# Patient Record
Sex: Female | Born: 1935 | Race: White | Hispanic: No | Smoking: Never smoker
Health system: Southern US, Community
[De-identification: ages and names within clinical notes are randomized; demographics above are authoritative.]

## PROBLEM LIST (undated history)

## (undated) DIAGNOSIS — I2510 Atherosclerotic heart disease of native coronary artery without angina pectoris: Secondary | ICD-10-CM

## (undated) HISTORY — PX: HERNIA REPAIR: SHX51

## (undated) HISTORY — PX: CHOLECYSTECTOMY: SHX55

---

## 2009-06-28 ENCOUNTER — Ambulatory Visit: Payer: Self-pay | Admitting: Interventional Radiology

## 2009-06-28 ENCOUNTER — Encounter: Payer: Self-pay | Admitting: Emergency Medicine

## 2009-06-29 ENCOUNTER — Inpatient Hospital Stay (HOSPITAL_COMMUNITY): Admission: EM | Admit: 2009-06-29 | Discharge: 2009-07-02 | Payer: Self-pay | Admitting: Emergency Medicine

## 2009-06-29 ENCOUNTER — Ambulatory Visit: Payer: Self-pay | Admitting: Cardiovascular Disease

## 2009-06-30 ENCOUNTER — Encounter: Payer: Self-pay | Admitting: Cardiovascular Disease

## 2010-04-27 ENCOUNTER — Emergency Department (HOSPITAL_BASED_OUTPATIENT_CLINIC_OR_DEPARTMENT_OTHER): Admission: EM | Admit: 2010-04-27 | Discharge: 2010-04-27 | Payer: Self-pay | Admitting: Emergency Medicine

## 2010-04-27 ENCOUNTER — Ambulatory Visit: Payer: Self-pay | Admitting: Diagnostic Radiology

## 2010-10-30 LAB — BASIC METABOLIC PANEL
BUN: 14 mg/dL (ref 6–23)
CO2: 27 mEq/L (ref 19–32)
CO2: 27 mEq/L (ref 19–32)
Chloride: 105 mEq/L (ref 96–112)
GFR calc Af Amer: >60 mL/min (ref >60)
Glucose, Bld: 100 mg/dL — ABNORMAL HIGH (ref 70–99)
Potassium: 3.8 mEq/L (ref 3.5–5.1)
Potassium: 3.9 mEq/L (ref 3.5–5.1)
Sodium: 140 mEq/L (ref 135–145)
Sodium: 144 mEq/L (ref 135–145)

## 2010-10-30 LAB — CBC
HCT: 32.7 % — ABNORMAL LOW (ref 36.0–46.0)
Hemoglobin: 11.4 g/dL — ABNORMAL LOW (ref 12.0–15.0)
Hemoglobin: 12.6 g/dL (ref 12.0–15.0)
MCHC: 35 g/dL (ref 30.0–36.0)
MCHC: 35.5 g/dL (ref 30.0–36.0)
MCV: 92.3 fL (ref 78.0–100.0)
MCV: 92.9 fL (ref 78.0–100.0)
Platelets: 170 10*3/uL (ref 150–400)
RBC: 3.82 MIL/uL — ABNORMAL LOW (ref 3.87–5.11)
RDW: 13.8 % (ref 11.5–15.5)
WBC: 7.1 10*3/uL (ref 4.0–10.5)

## 2010-10-30 LAB — POCT CARDIAC MARKERS
CKMB, poc: 2.5 ng/mL (ref 1.0–8.0)
CKMB, poc: 4 ng/mL (ref 1.0–8.0)
Myoglobin, poc: 107 ng/mL (ref 12–200)
Myoglobin, poc: 112 ng/mL (ref 12–200)

## 2010-10-30 LAB — DIFFERENTIAL
Basophils Relative: 1 % (ref 0–1)
Eosinophils Absolute: 0.1 10*3/uL (ref 0.0–0.7)
Lymphs Abs: 1.9 10*3/uL (ref 0.7–4.0)
Monocytes Absolute: 0.6 10*3/uL (ref 0.1–1.0)
Monocytes Relative: 8 % (ref 3–12)
Neutrophils Relative %: 63 % (ref 43–77)

## 2010-10-30 LAB — POCT I-STAT, CHEM 8
BUN: 16 mg/dL (ref 6–23)
Creatinine, Ser: 0.8 mg/dL (ref 0.4–1.2)
Glucose, Bld: 94 mg/dL (ref 70–99)
Potassium: 3.8 mEq/L (ref 3.5–5.1)
Sodium: 141 mEq/L (ref 135–145)

## 2010-10-30 LAB — CK TOTAL AND CKMB (NOT AT ARMC)
CK, MB: 6.5 ng/mL — ABNORMAL HIGH (ref 0.3–4.0)
Total CK: 105 U/L (ref 7–177)

## 2010-12-02 IMAGING — CR DG SHOULDER 2+V*R*
3 series · 3 of 3 positions shown · non-contrast
Comparison: None.

CLINICAL DATA: Right shoulder pain following a fall tonight.

RIGHT SHOULDER - 2+ VIEW

[w shoulder ap internal righ]
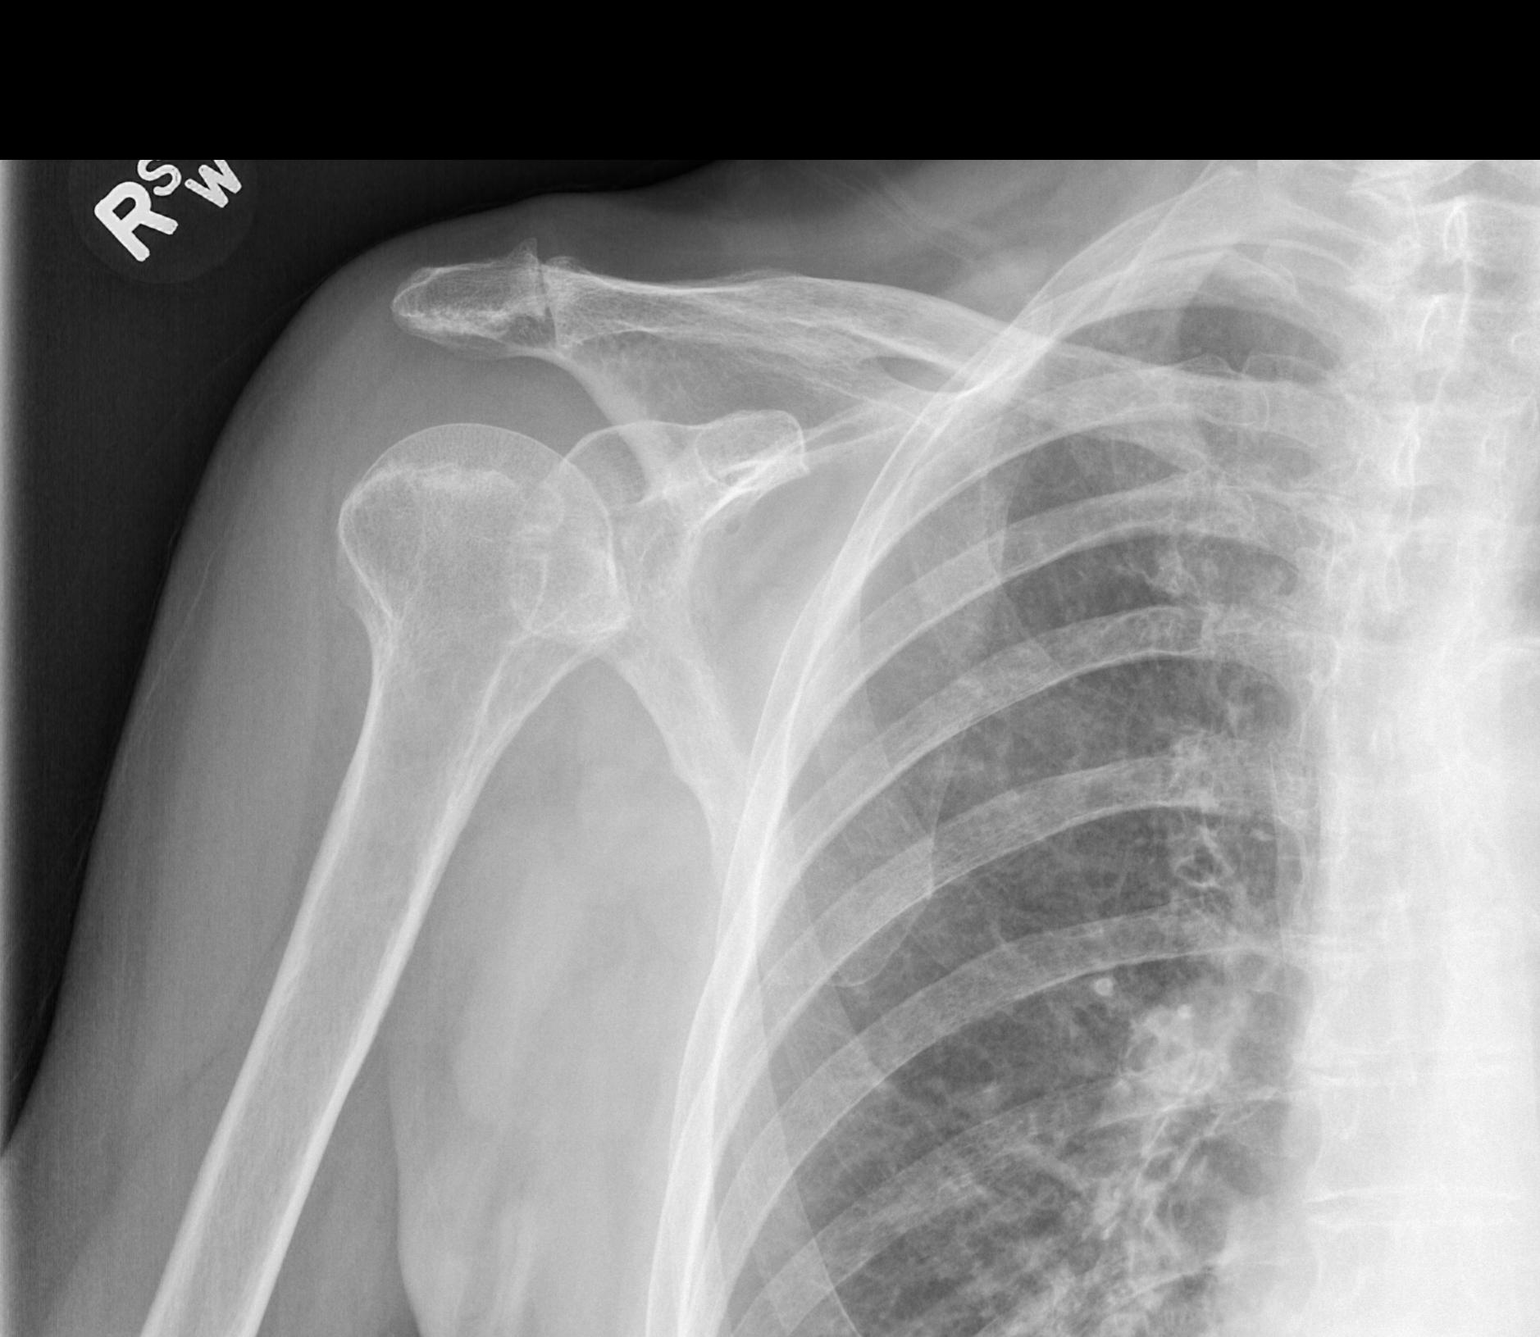

[w shoulder ap external righ]
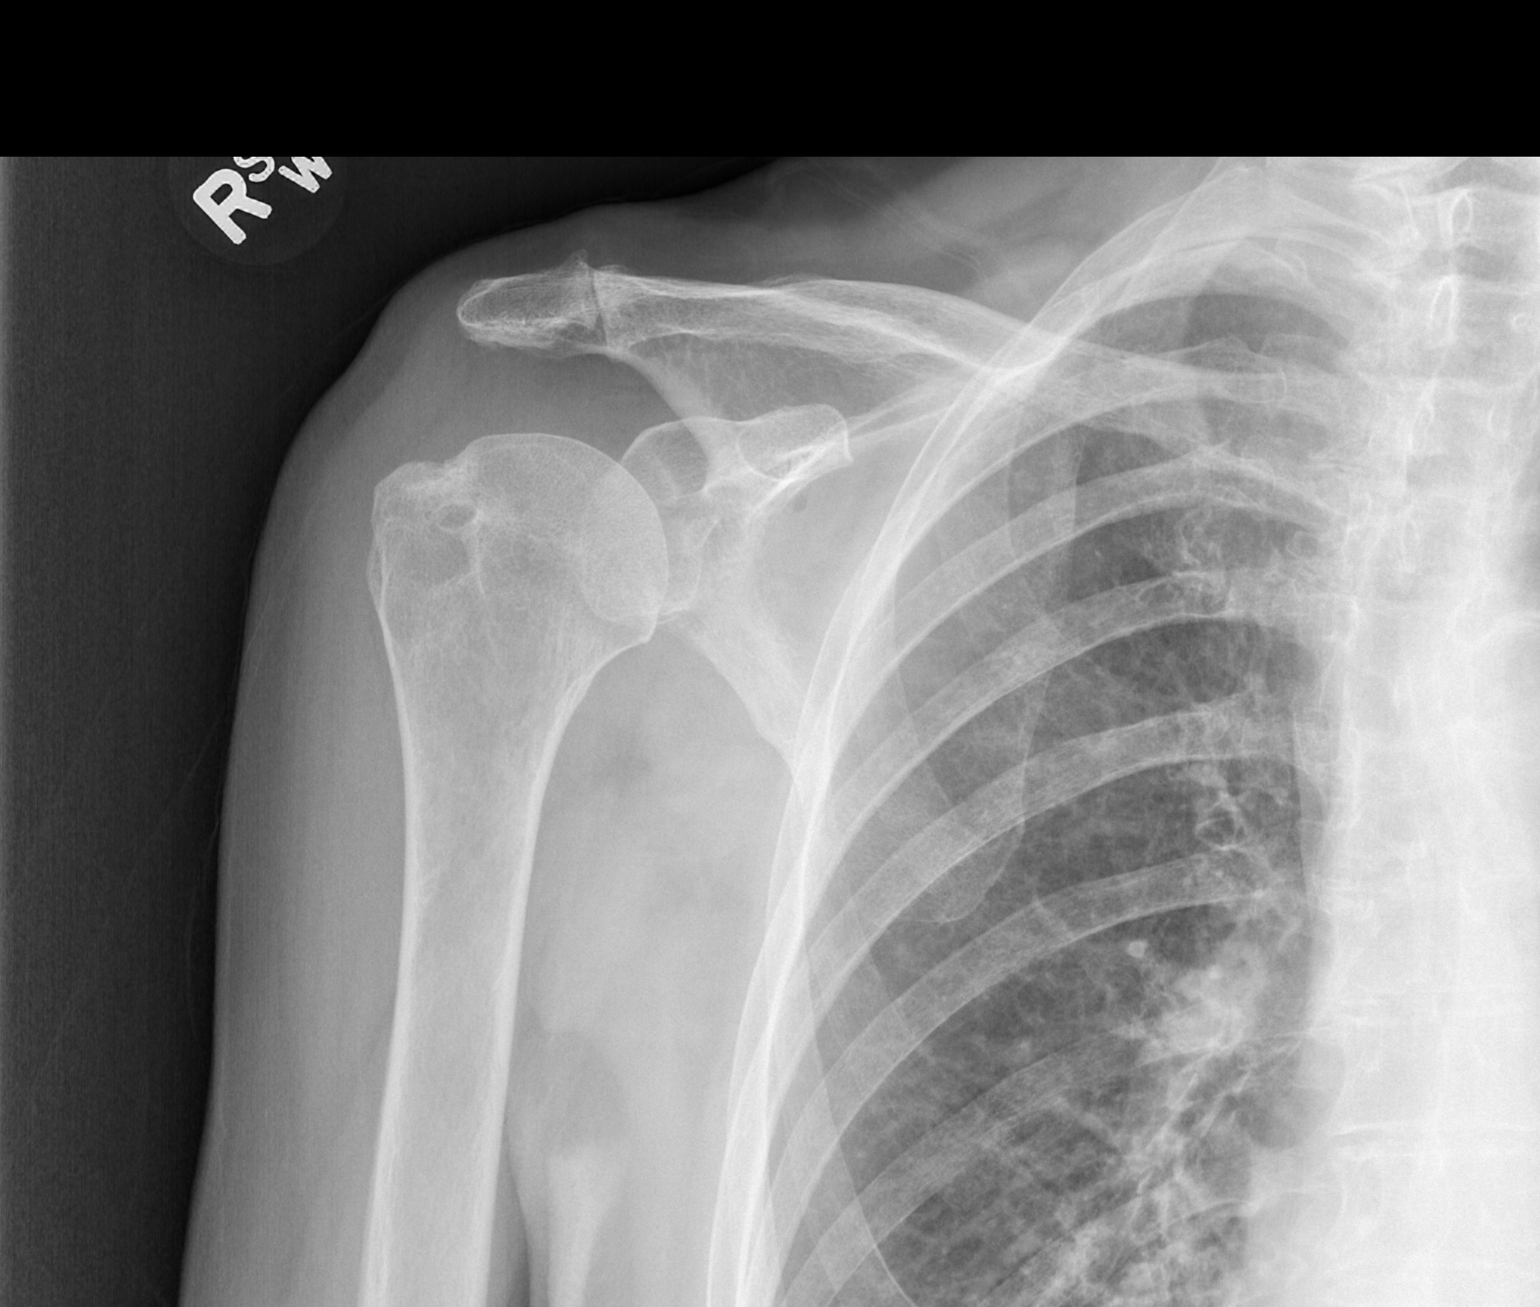

[w shoulder y view right]
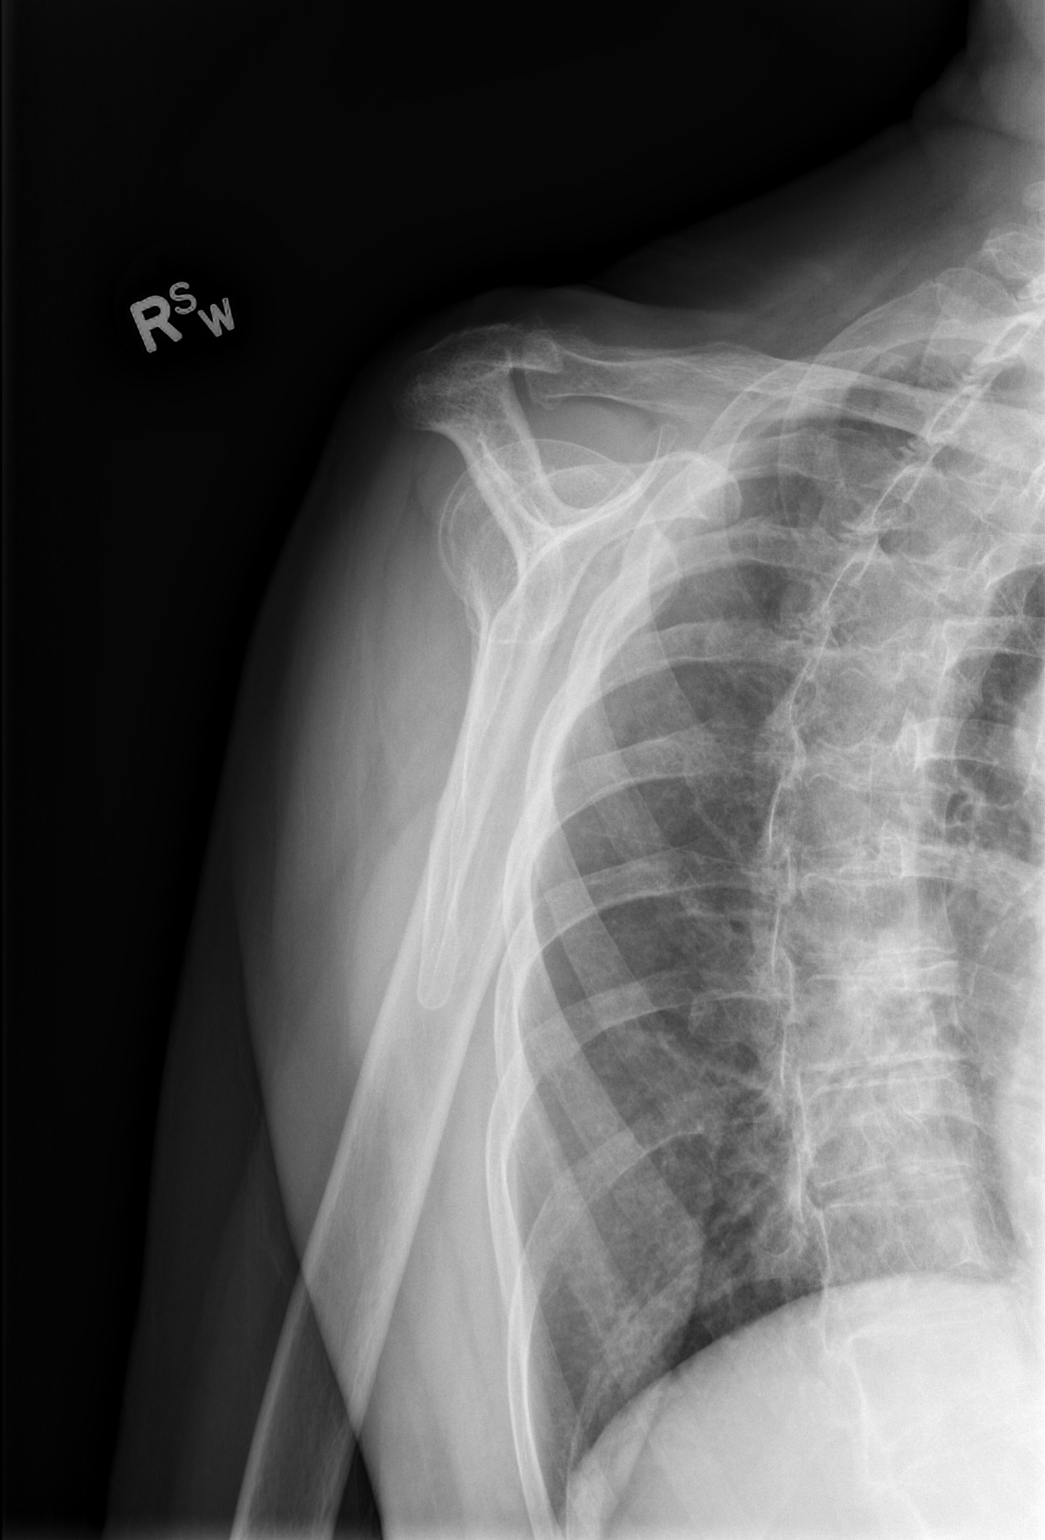

[3 of 3 positions shown; findings below may reference images not displayed]

FINDINGS: Moderate acromioclavicular spur formation.  Old, healed
distal clavicle fracture.  Inferior subluxation of the distal
clavicle relative to the acromion.  Humeral head and neck junction
cyst formation.  No acute fracture or dislocation seen.
IMPRESSION: No acute fracture or dislocation.

## 2011-01-10 ENCOUNTER — Emergency Department (HOSPITAL_BASED_OUTPATIENT_CLINIC_OR_DEPARTMENT_OTHER)
Admission: EM | Admit: 2011-01-10 | Discharge: 2011-01-10 | Disposition: A | Discharge status: Home / Self Care | Payer: Medicare Other | Attending: Emergency Medicine | Admitting: Emergency Medicine

## 2011-01-10 ENCOUNTER — Emergency Department (INDEPENDENT_AMBULATORY_CARE_PROVIDER_SITE_OTHER): Payer: Medicare Other

## 2011-01-10 DIAGNOSIS — M542 Cervicalgia: Secondary | ICD-10-CM | POA: Insufficient documentation

## 2011-01-10 DIAGNOSIS — R079 Chest pain, unspecified: Secondary | ICD-10-CM

## 2011-01-10 DIAGNOSIS — S139XXA Sprain of joints and ligaments of unspecified parts of neck, initial encounter: Secondary | ICD-10-CM | POA: Insufficient documentation

## 2011-01-10 DIAGNOSIS — W1809XA Striking against other object with subsequent fall, initial encounter: Secondary | ICD-10-CM | POA: Insufficient documentation

## 2011-01-10 DIAGNOSIS — I517 Cardiomegaly: Secondary | ICD-10-CM | POA: Insufficient documentation

## 2011-01-10 DIAGNOSIS — W19XXXA Unspecified fall, initial encounter: Secondary | ICD-10-CM

## 2011-01-10 DIAGNOSIS — I10 Essential (primary) hypertension: Secondary | ICD-10-CM | POA: Insufficient documentation

## 2011-01-10 DIAGNOSIS — E7800 Pure hypercholesterolemia, unspecified: Secondary | ICD-10-CM | POA: Insufficient documentation

## 2011-01-10 DIAGNOSIS — Y9229 Other specified public building as the place of occurrence of the external cause: Secondary | ICD-10-CM | POA: Insufficient documentation

## 2011-01-10 DIAGNOSIS — M47812 Spondylosis without myelopathy or radiculopathy, cervical region: Secondary | ICD-10-CM

## 2011-01-10 DIAGNOSIS — Z79899 Other long term (current) drug therapy: Secondary | ICD-10-CM | POA: Insufficient documentation

## 2011-01-10 DIAGNOSIS — M25519 Pain in unspecified shoulder: Secondary | ICD-10-CM

## 2013-12-08 DIAGNOSIS — H6120 Impacted cerumen, unspecified ear: Secondary | ICD-10-CM | POA: Diagnosis not present

## 2013-12-08 DIAGNOSIS — H903 Sensorineural hearing loss, bilateral: Secondary | ICD-10-CM | POA: Diagnosis not present

## 2013-12-29 DIAGNOSIS — D649 Anemia, unspecified: Secondary | ICD-10-CM | POA: Diagnosis not present

## 2013-12-29 DIAGNOSIS — I10 Essential (primary) hypertension: Secondary | ICD-10-CM | POA: Diagnosis not present

## 2013-12-29 DIAGNOSIS — E785 Hyperlipidemia, unspecified: Secondary | ICD-10-CM | POA: Diagnosis not present

## 2013-12-29 DIAGNOSIS — R7301 Impaired fasting glucose: Secondary | ICD-10-CM | POA: Diagnosis not present

## 2013-12-29 DIAGNOSIS — K439 Ventral hernia without obstruction or gangrene: Secondary | ICD-10-CM | POA: Diagnosis not present

## 2014-05-11 DIAGNOSIS — J309 Allergic rhinitis, unspecified: Secondary | ICD-10-CM | POA: Diagnosis not present

## 2014-05-11 DIAGNOSIS — E785 Hyperlipidemia, unspecified: Secondary | ICD-10-CM | POA: Diagnosis not present

## 2014-05-11 DIAGNOSIS — I10 Essential (primary) hypertension: Secondary | ICD-10-CM | POA: Diagnosis not present

## 2014-05-11 DIAGNOSIS — I5181 Takotsubo syndrome: Secondary | ICD-10-CM | POA: Diagnosis not present

## 2014-05-11 DIAGNOSIS — H6120 Impacted cerumen, unspecified ear: Secondary | ICD-10-CM | POA: Diagnosis not present

## 2014-05-18 DIAGNOSIS — E785 Hyperlipidemia, unspecified: Secondary | ICD-10-CM | POA: Diagnosis not present

## 2014-05-18 DIAGNOSIS — I10 Essential (primary) hypertension: Secondary | ICD-10-CM | POA: Diagnosis not present

## 2014-06-15 DIAGNOSIS — H60392 Other infective otitis externa, left ear: Secondary | ICD-10-CM | POA: Diagnosis not present

## 2014-06-15 DIAGNOSIS — H6122 Impacted cerumen, left ear: Secondary | ICD-10-CM | POA: Diagnosis not present

## 2014-09-14 DIAGNOSIS — H6120 Impacted cerumen, unspecified ear: Secondary | ICD-10-CM | POA: Diagnosis not present

## 2014-09-14 DIAGNOSIS — H9193 Unspecified hearing loss, bilateral: Secondary | ICD-10-CM | POA: Diagnosis not present

## 2014-12-11 DIAGNOSIS — I2510 Atherosclerotic heart disease of native coronary artery without angina pectoris: Secondary | ICD-10-CM | POA: Diagnosis not present

## 2014-12-11 DIAGNOSIS — K219 Gastro-esophageal reflux disease without esophagitis: Secondary | ICD-10-CM | POA: Diagnosis not present

## 2014-12-11 DIAGNOSIS — I214 Non-ST elevation (NSTEMI) myocardial infarction: Secondary | ICD-10-CM | POA: Diagnosis not present

## 2014-12-11 DIAGNOSIS — I10 Essential (primary) hypertension: Secondary | ICD-10-CM | POA: Diagnosis present

## 2014-12-11 DIAGNOSIS — R079 Chest pain, unspecified: Secondary | ICD-10-CM | POA: Diagnosis not present

## 2014-12-11 DIAGNOSIS — E785 Hyperlipidemia, unspecified: Secondary | ICD-10-CM | POA: Diagnosis not present

## 2014-12-11 DIAGNOSIS — I213 ST elevation (STEMI) myocardial infarction of unspecified site: Secondary | ICD-10-CM | POA: Diagnosis not present

## 2014-12-11 DIAGNOSIS — I5181 Takotsubo syndrome: Secondary | ICD-10-CM | POA: Diagnosis present

## 2015-01-10 DIAGNOSIS — H6123 Impacted cerumen, bilateral: Secondary | ICD-10-CM | POA: Diagnosis not present

## 2015-03-29 DIAGNOSIS — H6123 Impacted cerumen, bilateral: Secondary | ICD-10-CM | POA: Diagnosis not present

## 2015-04-11 ENCOUNTER — Emergency Department (HOSPITAL_BASED_OUTPATIENT_CLINIC_OR_DEPARTMENT_OTHER): Payer: Medicare Other

## 2015-04-11 ENCOUNTER — Encounter (HOSPITAL_BASED_OUTPATIENT_CLINIC_OR_DEPARTMENT_OTHER): Payer: Self-pay | Admitting: *Deleted

## 2015-04-11 ENCOUNTER — Emergency Department (HOSPITAL_BASED_OUTPATIENT_CLINIC_OR_DEPARTMENT_OTHER)
Admission: EM | Admit: 2015-04-11 | Discharge: 2015-04-11 | Disposition: A | Discharge status: Home / Self Care | Payer: Medicare Other | Attending: Emergency Medicine | Admitting: Emergency Medicine

## 2015-04-11 DIAGNOSIS — W010XXA Fall on same level from slipping, tripping and stumbling without subsequent striking against object, initial encounter: Secondary | ICD-10-CM | POA: Diagnosis not present

## 2015-04-11 DIAGNOSIS — T07XXXA Unspecified multiple injuries, initial encounter: Secondary | ICD-10-CM

## 2015-04-11 DIAGNOSIS — S0083XA Contusion of other part of head, initial encounter: Secondary | ICD-10-CM | POA: Insufficient documentation

## 2015-04-11 DIAGNOSIS — S0993XA Unspecified injury of face, initial encounter: Secondary | ICD-10-CM | POA: Diagnosis not present

## 2015-04-11 DIAGNOSIS — S0081XA Abrasion of other part of head, initial encounter: Secondary | ICD-10-CM | POA: Diagnosis not present

## 2015-04-11 DIAGNOSIS — Y9389 Activity, other specified: Secondary | ICD-10-CM | POA: Diagnosis not present

## 2015-04-11 DIAGNOSIS — S9002XA Contusion of left ankle, initial encounter: Secondary | ICD-10-CM | POA: Diagnosis not present

## 2015-04-11 DIAGNOSIS — S8001XA Contusion of right knee, initial encounter: Secondary | ICD-10-CM | POA: Insufficient documentation

## 2015-04-11 DIAGNOSIS — S0990XA Unspecified injury of head, initial encounter: Secondary | ICD-10-CM | POA: Diagnosis not present

## 2015-04-11 DIAGNOSIS — M7989 Other specified soft tissue disorders: Secondary | ICD-10-CM | POA: Diagnosis not present

## 2015-04-11 DIAGNOSIS — S52592A Other fractures of lower end of left radius, initial encounter for closed fracture: Secondary | ICD-10-CM | POA: Insufficient documentation

## 2015-04-11 DIAGNOSIS — M25572 Pain in left ankle and joints of left foot: Secondary | ICD-10-CM | POA: Diagnosis not present

## 2015-04-11 DIAGNOSIS — Y998 Other external cause status: Secondary | ICD-10-CM | POA: Insufficient documentation

## 2015-04-11 DIAGNOSIS — Y9289 Other specified places as the place of occurrence of the external cause: Secondary | ICD-10-CM | POA: Insufficient documentation

## 2015-04-11 DIAGNOSIS — S52502A Unspecified fracture of the lower end of left radius, initial encounter for closed fracture: Secondary | ICD-10-CM

## 2015-04-11 DIAGNOSIS — S6991XA Unspecified injury of right wrist, hand and finger(s), initial encounter: Secondary | ICD-10-CM | POA: Diagnosis not present

## 2015-04-11 DIAGNOSIS — I2510 Atherosclerotic heart disease of native coronary artery without angina pectoris: Secondary | ICD-10-CM | POA: Diagnosis not present

## 2015-04-11 DIAGNOSIS — S6992XA Unspecified injury of left wrist, hand and finger(s), initial encounter: Secondary | ICD-10-CM | POA: Diagnosis present

## 2015-04-11 DIAGNOSIS — S52572A Other intraarticular fracture of lower end of left radius, initial encounter for closed fracture: Secondary | ICD-10-CM | POA: Diagnosis not present

## 2015-04-11 HISTORY — DX: Atherosclerotic heart disease of native coronary artery without angina pectoris: I25.10

## 2015-04-11 MED ORDER — ACETAMINOPHEN 325 MG PO TABS
650.0000 mg | ORAL_TABLET | Freq: Once | ORAL | Status: AC
  Administered 2015-04-11: 650 mg via ORAL
  Filled 2015-04-11: qty 2

## 2015-04-11 NOTE — ED Notes (Signed)
She tripped and fell face first landing on concrete. By standers helped her up and she drove herself to her daughters. Injury to her chin, right knee and left wrist. Bruise and swelling to her chin and knee. Swelling and pain to her wrist.

## 2015-04-11 NOTE — ED Notes (Signed)
Tracy Gerken placed the splint and sling on patient.

## 2015-04-11 NOTE — Discharge Instructions (Signed)
Contusion °A contusion is a deep bruise. Contusions happen when an injury causes bleeding under the skin. Signs of bruising include pain, puffiness (swelling), and discolored skin. The contusion may turn blue, purple, or yellow. °HOME CARE  °· Put ice on the injured area. °¨ Put ice in a plastic bag. °¨ Place a towel between your skin and the bag. °¨ Leave the ice on for 15-20 minutes, 03-04 times a day. °· Only take medicine as told by your doctor. °· Rest the injured area. °· If possible, raise (elevate) the injured area to lessen puffiness. °GET HELP RIGHT AWAY IF:  °· You have more bruising or puffiness. °· You have pain that is getting worse. °· Your puffiness or pain is not helped by medicine. °MAKE SURE YOU:  °· Understand these instructions. °· Will watch your condition. °· Will get help right away if you are not doing well or get worse. °Document Released: 01/01/2008 Document Revised: 10/07/2011 Document Reviewed: 05/20/2011 °[employer]® Patient Information ©2015 [employer]. This information is not intended to replace advice given to you by your health care provider. Make sure you discuss any questions you have with your health care provider. ° °

## 2015-04-11 NOTE — ED Provider Notes (Addendum)
CSN: [id number]     Arrival date & time 04/11/15  1452 History   First MD Initiated Contact with Patient 04/11/15 1508     Chief Complaint  Patient presents with  . Fall     (Consider location/radiation/quality/duration/timing/severity/associated sxs/prior Treatment) Patient is a 81 y.o. female presenting with fall. The history is provided by the patient.  Fall This is a new (Pt drove to town and was walking by the [employer] looking in the windows.  when she was walking back to her car she tripped and fell face first onthe concrete.  2 men helped her up and she was able to drive to her daughter and grandaughter's house ) problem. The current episode started 1 to 2 hours ago. The problem occurs constantly. The problem has been gradually worsening. Associated symptoms include headaches. Pertinent negatives include no chest pain, no abdominal pain and no shortness of breath. Associated symptoms comments: Left wrist, right hand, left ankle, right knee pain.  Severe chin pain and bruising.  Possible brief LOC after hitting face on the pavement.  No neck or back pain.  Was able to ambulate after the fall.  No anticoagulation except for 81mg  asa.. The symptoms are aggravated by bending and twisting. The symptoms are relieved by rest. She has tried rest and a cold compress for the symptoms. The treatment provided no relief.    Past Medical History  Diagnosis Date  . Coronary artery disease    Past Surgical History  Procedure Laterality Date  . Cholecystectomy    . Hernia repair     No family history on file. Social History  Substance Use Topics  . Smoking status: Never Smoker   . Smokeless tobacco: None  . Alcohol Use: No   OB History    No data available     Review of Systems  Respiratory: Negative for shortness of breath.   Cardiovascular: Negative for chest pain.  Gastrointestinal: Negative for abdominal pain.  Neurological: Positive for headaches.  All other systems reviewed and  are negative.     Allergies  Review of patient's allergies indicates no known allergies.  Home Medications   Prior to Admission medications   Medication Sig Start Date End Date Taking? Authorizing Provider  Atorvastatin Calcium (LIPITOR PO) Take by mouth.   Yes Historical Provider, MD  CARVEDILOL PO Take by mouth.   Yes Historical Provider, MD   BP 126/101 mmHg  Pulse 77  Temp(Src) 98.5 F (36.9 C) (Oral)  Resp 20  Ht 5\' 2"  (1.575 m)  Wt 110 lb (49.896 kg)  BMI 20.11 kg/m2  SpO2 97% Physical Exam  Constitutional: She is oriented to person, place, and time. She appears well-developed and well-nourished. No distress.  HENT:  Head: Normocephalic. Head is with abrasion.    Mouth/Throat: Oropharynx is clear and moist. No trismus in the jaw. No lacerations.  No acute dental fractures   Eyes: Conjunctivae and EOM are normal. Pupils are equal, round, and reactive to light.  Neck: Normal range of motion. Neck supple. No spinous process tenderness and no muscular tenderness present.  Cardiovascular: Normal rate, regular rhythm and intact distal pulses.   No murmur heard. Pulmonary/Chest: Effort normal and breath sounds normal. No respiratory distress. She has no wheezes. She has no rales.  Abdominal: Soft. She exhibits no distension. There is no tenderness. There is no rebound and no guarding.  Musculoskeletal: She exhibits no edema.       Left wrist: She exhibits decreased range of motion,  tenderness, bony tenderness, swelling and deformity.       Right knee: She exhibits swelling and ecchymosis. She exhibits normal range of motion. Tenderness found. Medial joint line tenderness noted.       Left ankle: She exhibits ecchymosis. She exhibits normal range of motion and no deformity. Tenderness. Lateral malleolus tenderness found.       Right hand: She exhibits tenderness and bony tenderness. She exhibits normal range of motion.       Hands:      Legs: Neurological: She is alert and  oriented to person, place, and time.  Skin: Skin is warm and dry. No rash noted. No erythema.  Psychiatric: She has a normal mood and affect. Her behavior is normal.  Nursing note and vitals reviewed.   ED Course  Procedures (including critical care time) Labs Review Labs Reviewed - No data to display  Imaging Review Dg Wrist Complete Left  04/11/2015   CLINICAL DATA:  Tripped, fall face first landing on concrete. Left wrist pain.  EXAM: LEFT WRIST - COMPLETE 3+ VIEW  COMPARISON:  06/28/2009  FINDINGS: There is a comminuted intraarticular fracture through the distal left radius. Ulnar styloid fracture noted.  Advanced degenerative changes at the first carpometacarpal joint.  IMPRESSION: Comminuted, slightly impacted intra-articular distal left radial fracture. Ulnar styloid fracture.   Electronically Signed   By: Charlett Nose M.D.   On: 04/11/2015 16:31   Dg Ankle Complete Left  04/11/2015   CLINICAL DATA:  Fall.  Ankle pain.  EXAM: LEFT ANKLE COMPLETE - 3+ VIEW  COMPARISON:  None.  FINDINGS: The ankle mortise is congruent. The talar dome appears intact. No fracture is identified.  IMPRESSION: Negative.   Electronically Signed   By: Andreas Newport M.D.   On: 04/11/2015 16:34   Ct Head Wo Contrast  04/11/2015   CLINICAL DATA:  Fall from a standing position. Soft tissue injury to the chin.  EXAM: CT HEAD WITHOUT CONTRAST  CT MAXILLOFACIAL WITHOUT CONTRAST  TECHNIQUE: Multidetector CT imaging of the head and maxillofacial structures were performed using the standard protocol without intravenous contrast. Multiplanar CT image reconstructions of the maxillofacial structures were also generated.  COMPARISON:  CT head without contrast 03/20/2011  FINDINGS: CT HEAD FINDINGS  A thin extra-axial collection is present bilaterally. This is slightly higher density than the CSF. Maximal measurement is 4 mm on the right and 3 mm on the left. There is no mass effect or midline shift. These are concerning for  small subdural hematomas, right greater than left. They are age indeterminate without layering acute blood products.  Mild subcortical white matter changes are present bilaterally. A remote lacunar infarct is present in the left caudate head. The basal ganglia are otherwise intact. No acute infarct or parenchymal hemorrhage is present. The ventricles are of normal size.  A prominent polyp or mucous retention cyst is present in the right maxillary sinus. The remaining paranasal sinuses in the right mastoid air cells are clear. There is some fluid in left mastoid air cells. No obstructing nasopharyngeal lesion is evident. Atherosclerotic calcifications are present in the cavernous internal carotid arteries bilaterally.  CT MAXILLOFACIAL FINDINGS  A soft tissue hematoma at is noted along the anterior mandible. There is no underlying fracture. The residual teeth are intact.  The mandible is intact and located. A polyp or mucous retention cyst is present in the right maxillary sinus. The facial bones are otherwise intact. Bilateral sub cm submandibular lymph nodes.  Degenerative changes are  present in the upper cervical spine without acute fracture or traumatic subluxation.  IMPRESSION: 1. Small bilateral subdural collections, concerning for age indeterminate hematomas. 2. Mild white matter disease is within normal limits for age. 3. Remote lacunar infarct of the left caudate head. 4. Prominent hematoma anterior to the genu of the mandible without an underlying fracture. These results were called by telephone at the time of interpretation on 04/11/2015 at 4:26 pm to Dr. Gwyneth Sprout , who verbally acknowledged these results.   Electronically Signed   By: Marin Roberts M.D.   On: 04/11/2015 16:31   Dg Knee Complete 4 Views Right  04/11/2015   CLINICAL DATA:  initial encounter tripped and fell face first landing on concrete. By standers helped her up and she drove herself to her daughters. Injury to her chin,  right knee and left wrist. Bruise and swelling to her chin and knee. Swelling and pain to her wrist.  EXAM: RIGHT KNEE - COMPLETE 4+ VIEW  COMPARISON:  None.  FINDINGS: Medial lateral meniscal calcification. Mild narrowing and osteophyte formation of the medial compartment. No fracture or dislocation. No joint effusion. Femoral popliteal artery calcification. Nonspecific soft tissue calcification in the suprapatellar region.  IMPRESSION: No acute findings   Electronically Signed   By: Esperanza Heir M.D.   On: 04/11/2015 16:32   Dg Hand Complete Right  04/11/2015   CLINICAL DATA:  Fall.  Hand pain.  Hand injury.  EXAM: RIGHT HAND - COMPLETE 3+ VIEW  COMPARISON:  None.  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 Small vessel atherosclerotic calcification is present. There is no displaced fracture. Index finger DIP joint osteoarthritis. Mild small finger DIP joint osteoarthritis. Distal radius grossly appears within normal limits however there is soft tissue swelling over the dorsal aspect of the wrist. Severe STT joint osteoarthritis.  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 If there is wrist pain, consider dedicated wrist radiographs.   Electronically Signed   By: Andreas Newport M.D.   On: 04/11/2015 16:33   Ct Maxillofacial Wo Cm  04/11/2015   CLINICAL DATA:  Fall from a standing position. Soft tissue injury to the chin.  EXAM: CT HEAD WITHOUT CONTRAST  CT MAXILLOFACIAL WITHOUT CONTRAST  TECHNIQUE: Multidetector CT imaging of the head and maxillofacial structures were performed using the standard protocol without intravenous contrast. Multiplanar CT image reconstructions of the maxillofacial structures were also generated.  COMPARISON:  CT head without contrast 03/20/2011  FINDINGS: CT HEAD FINDINGS  A thin extra-axial collection is present bilaterally. This is slightly higher density than the CSF. Maximal measurement is 4 mm on the right and 3  mm on the left. There is no mass effect or midline shift. These are concerning for small subdural hematomas, right greater than left. They are age indeterminate without layering acute blood products.  Mild subcortical white matter changes are present bilaterally. A remote lacunar infarct is present in the left caudate head. The basal ganglia are otherwise intact. No acute infarct or parenchymal hemorrhage is present. The ventricles are of normal size.  A prominent polyp or mucous retention cyst is present in the right maxillary sinus. The remaining paranasal sinuses in the right mastoid air cells are clear. There is some fluid in left mastoid air cells. No obstructing nasopharyngeal lesion is evident. Atherosclerotic calcifications are present in the cavernous internal carotid arteries bilaterally.  CT MAXILLOFACIAL FINDINGS  A soft tissue hematoma at is noted along the anterior mandible.  There is no underlying fracture. The residual teeth are intact.  The mandible is intact and located. A polyp or mucous retention cyst is present in the right maxillary sinus. The facial bones are otherwise intact. Bilateral sub cm submandibular lymph nodes.  Degenerative changes are present in the upper cervical spine without acute fracture or traumatic subluxation.  IMPRESSION: 1. Small bilateral subdural collections, concerning for age indeterminate hematomas. 2. Mild white matter disease is within normal limits for age. 3. Remote lacunar infarct of the left caudate head. 4. Prominent hematoma anterior to the genu of the mandible without an underlying fracture. These results were called by telephone at the time of interpretation on 04/11/2015 at 4:26 pm to Dr. Gwyneth Sprout , who verbally acknowledged these results.   Electronically Signed   By: Marin Roberts M.D.   On: 04/11/2015 16:31   I have personally reviewed and evaluated these images and lab results as part of my medical decision-making.   EKG  Interpretation None      MDM   Final diagnoses:  Distal radius fracture, left, closed, initial encounter  Contusion, multiple sites    Pt with mechanical fall today outside a [employer] store. Landed on her face, outstretched arms and knees on the concrete.  Unclear if she had LOC.  She was able to ambulate after and drove to her relatives house.  She denies hip or upper leg pain.  Mild left ankle pain and right knee pain.  Deformity and severe pain over the left wrist.  Mild right hand pain.  No c-spine tenderness.  C/o of mild HA and large chin hematoma.  No malocclusion and vision is normal.  Neuro exam wnl.  Pt denies any anticoagulation other than 81mg  asa.  Imaging pending and pt given tylenol for pain.  5:36 PM Patient states she is negative except for comminuted, impacted left distal radius fracture. Short arm splint and sling applied. Patient will follow-up with Dr. Amanda Pea.  Secondly patient's head CT showed small bilateral subdural collections is age indeterminate. These with the family patient had a bad fall approximately 5 years ago with intercranial hemorrhage requiring hospitalization. Since that time she has had no falls and she takes only a baby aspirin daily. Patient's mental status and neurologic exam is intact. Discussed with Dr. Dutch Quint who states that there is nothing to worry about patient needs to hold her aspirin for 2 days and can safely be discharged home. The family strict return precautions if mental status changes, gait difficulty or any other neurologic complaints to return for a repeat CT.   Gwyneth Sprout, MD 04/11/15 1738  Gwyneth Sprout, MD 04/11/15 1745

## 2015-04-11 NOTE — ED Notes (Signed)
Wheeled patient to room 11, assisted patient in transferring to bed. Patient assisted in undressing and getting into a gown. Patient's jewelry taken off by daughter.

## 2015-04-11 NOTE — ED Notes (Signed)
Called [facility]--spoke with Benna Dunks

## 2015-04-13 DIAGNOSIS — S52615A Nondisplaced fracture of left ulna styloid process, initial encounter for closed fracture: Secondary | ICD-10-CM | POA: Diagnosis not present

## 2015-04-13 DIAGNOSIS — S52572A Other intraarticular fracture of lower end of left radius, initial encounter for closed fracture: Secondary | ICD-10-CM | POA: Diagnosis not present

## 2015-04-13 DIAGNOSIS — M25532 Pain in left wrist: Secondary | ICD-10-CM | POA: Diagnosis not present

## 2015-04-20 DIAGNOSIS — S52615D Nondisplaced fracture of left ulna styloid process, subsequent encounter for closed fracture with routine healing: Secondary | ICD-10-CM | POA: Diagnosis not present

## 2015-04-20 DIAGNOSIS — S52572D Other intraarticular fracture of lower end of left radius, subsequent encounter for closed fracture with routine healing: Secondary | ICD-10-CM | POA: Diagnosis not present

## 2015-04-27 DIAGNOSIS — S52572D Other intraarticular fracture of lower end of left radius, subsequent encounter for closed fracture with routine healing: Secondary | ICD-10-CM | POA: Diagnosis not present

## 2015-04-27 DIAGNOSIS — M25532 Pain in left wrist: Secondary | ICD-10-CM | POA: Diagnosis not present

## 2015-04-27 DIAGNOSIS — S52615D Nondisplaced fracture of left ulna styloid process, subsequent encounter for closed fracture with routine healing: Secondary | ICD-10-CM | POA: Diagnosis not present

## 2015-05-08 DIAGNOSIS — S52615D Nondisplaced fracture of left ulna styloid process, subsequent encounter for closed fracture with routine healing: Secondary | ICD-10-CM | POA: Diagnosis not present

## 2015-05-08 DIAGNOSIS — M25532 Pain in left wrist: Secondary | ICD-10-CM | POA: Diagnosis not present

## 2015-05-08 DIAGNOSIS — S52572D Other intraarticular fracture of lower end of left radius, subsequent encounter for closed fracture with routine healing: Secondary | ICD-10-CM | POA: Diagnosis not present

## 2015-05-10 DIAGNOSIS — H6983 Other specified disorders of Eustachian tube, bilateral: Secondary | ICD-10-CM | POA: Diagnosis not present

## 2015-05-24 DIAGNOSIS — S52572D Other intraarticular fracture of lower end of left radius, subsequent encounter for closed fracture with routine healing: Secondary | ICD-10-CM | POA: Diagnosis not present

## 2015-06-26 DIAGNOSIS — S52615D Nondisplaced fracture of left ulna styloid process, subsequent encounter for closed fracture with routine healing: Secondary | ICD-10-CM | POA: Diagnosis not present

## 2015-06-26 DIAGNOSIS — S52572D Other intraarticular fracture of lower end of left radius, subsequent encounter for closed fracture with routine healing: Secondary | ICD-10-CM | POA: Diagnosis not present

## 2015-08-02 DIAGNOSIS — H6123 Impacted cerumen, bilateral: Secondary | ICD-10-CM | POA: Diagnosis not present

## 2015-11-16 IMAGING — CR DG ANKLE COMPLETE 3+V*L*
1 series · 1 of 1 positions shown · non-contrast
Comparison: None.

CLINICAL DATA: Fall.  Ankle pain.

EXAM:
LEFT ANKLE COMPLETE - 3+ VIEW

[view not recorded]
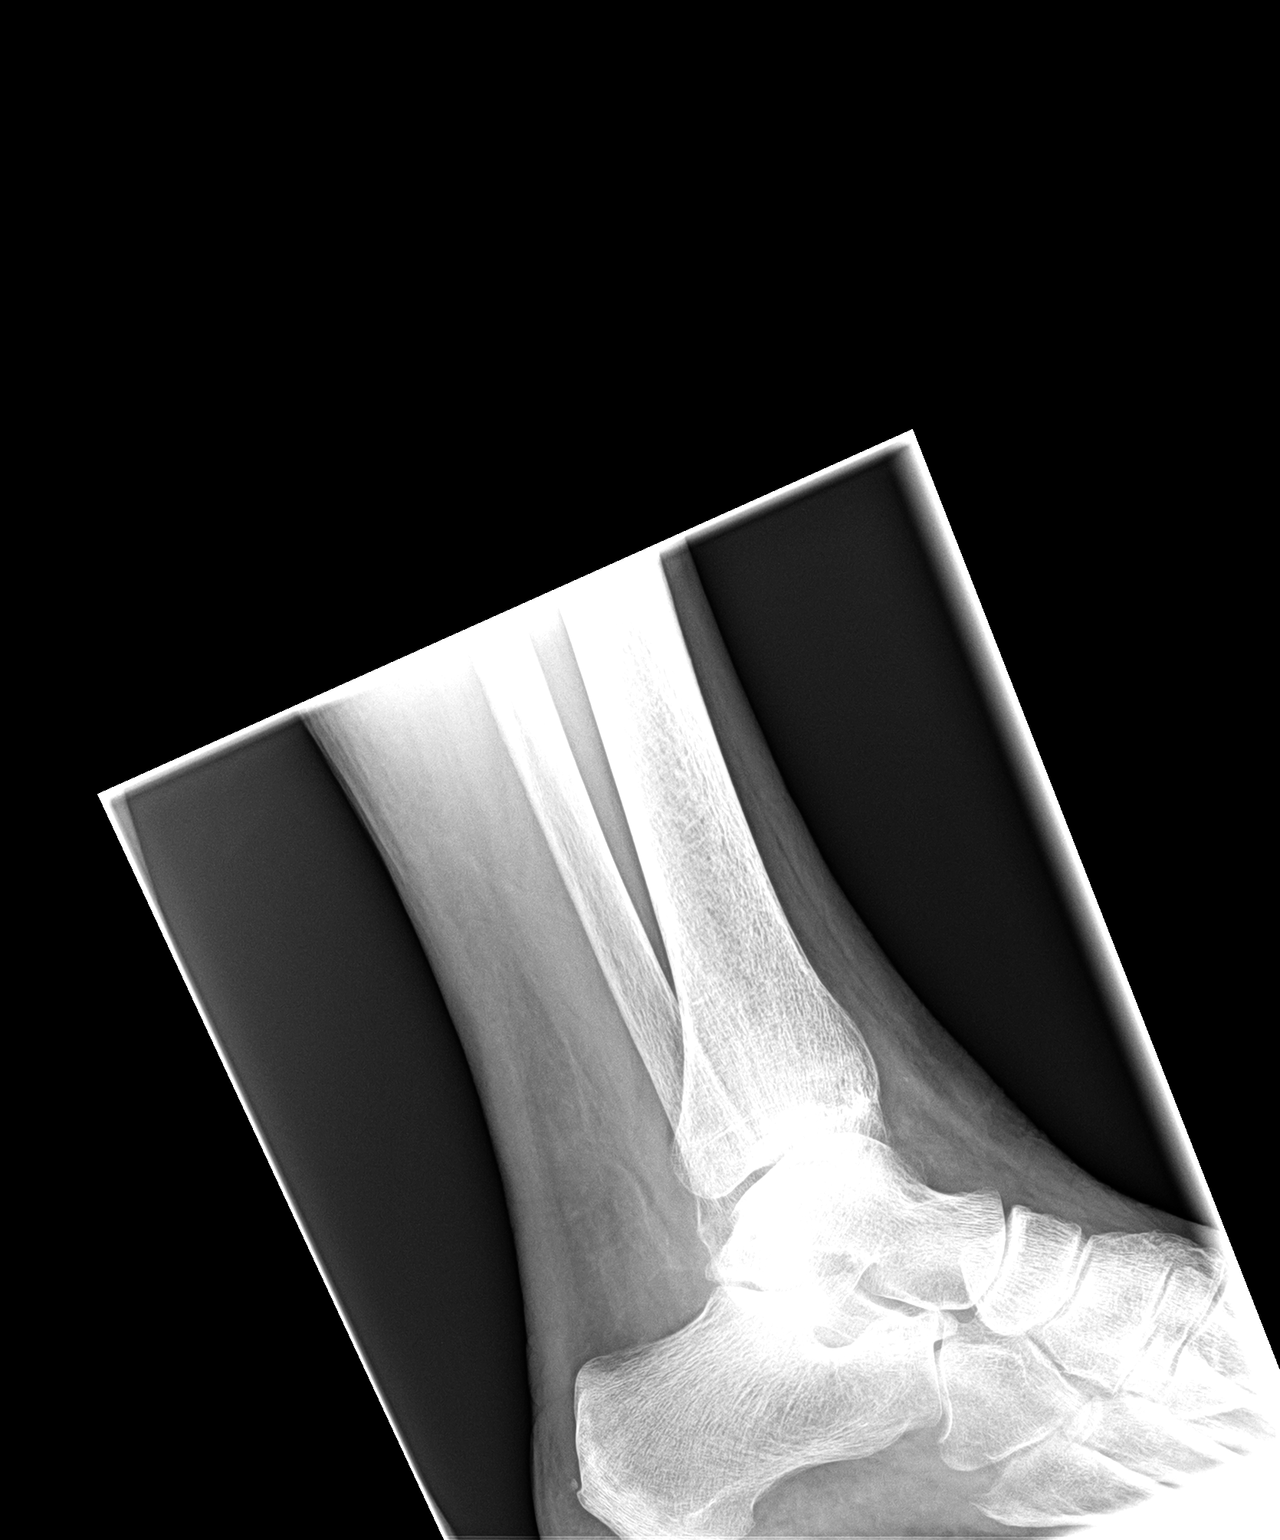

[1 of 1 positions shown; findings below may reference images not displayed]

FINDINGS: The ankle mortise is congruent. The talar dome appears intact. No
fracture is identified.
IMPRESSION: Negative.

## 2015-12-20 DIAGNOSIS — H6123 Impacted cerumen, bilateral: Secondary | ICD-10-CM | POA: Diagnosis not present

## 2016-03-28 DIAGNOSIS — H6123 Impacted cerumen, bilateral: Secondary | ICD-10-CM | POA: Diagnosis not present

## 2016-04-14 DIAGNOSIS — M25551 Pain in right hip: Secondary | ICD-10-CM | POA: Diagnosis not present

## 2016-04-14 DIAGNOSIS — S79911A Unspecified injury of right hip, initial encounter: Secondary | ICD-10-CM | POA: Diagnosis not present

## 2016-04-14 DIAGNOSIS — W19XXXA Unspecified fall, initial encounter: Secondary | ICD-10-CM | POA: Diagnosis not present

## 2016-04-14 DIAGNOSIS — S4991XA Unspecified injury of right shoulder and upper arm, initial encounter: Secondary | ICD-10-CM | POA: Diagnosis not present

## 2016-04-14 DIAGNOSIS — M25511 Pain in right shoulder: Secondary | ICD-10-CM | POA: Diagnosis not present

## 2016-04-14 DIAGNOSIS — S6991XA Unspecified injury of right wrist, hand and finger(s), initial encounter: Secondary | ICD-10-CM | POA: Diagnosis not present

## 2016-04-14 DIAGNOSIS — M79641 Pain in right hand: Secondary | ICD-10-CM | POA: Diagnosis not present

## 2016-04-23 DIAGNOSIS — M25562 Pain in left knee: Secondary | ICD-10-CM | POA: Diagnosis not present

## 2016-04-23 DIAGNOSIS — S8992XA Unspecified injury of left lower leg, initial encounter: Secondary | ICD-10-CM | POA: Diagnosis not present

## 2016-05-29 DIAGNOSIS — H6123 Impacted cerumen, bilateral: Secondary | ICD-10-CM | POA: Diagnosis not present

## 2016-05-29 DIAGNOSIS — H938X1 Other specified disorders of right ear: Secondary | ICD-10-CM | POA: Diagnosis not present

## 2016-08-19 DIAGNOSIS — H6122 Impacted cerumen, left ear: Secondary | ICD-10-CM | POA: Diagnosis not present

## 2016-08-29 DIAGNOSIS — M654 Radial styloid tenosynovitis [de Quervain]: Secondary | ICD-10-CM | POA: Diagnosis not present

## 2016-08-29 DIAGNOSIS — I5181 Takotsubo syndrome: Secondary | ICD-10-CM | POA: Diagnosis not present

## 2016-08-29 DIAGNOSIS — S6992XA Unspecified injury of left wrist, hand and finger(s), initial encounter: Secondary | ICD-10-CM | POA: Diagnosis not present

## 2016-08-29 DIAGNOSIS — M79645 Pain in left finger(s): Secondary | ICD-10-CM | POA: Diagnosis not present

## 2016-08-29 DIAGNOSIS — E785 Hyperlipidemia, unspecified: Secondary | ICD-10-CM | POA: Diagnosis not present

## 2016-08-29 DIAGNOSIS — M1990 Unspecified osteoarthritis, unspecified site: Secondary | ICD-10-CM | POA: Diagnosis not present

## 2016-10-21 DIAGNOSIS — H02135 Senile ectropion of left lower eyelid: Secondary | ICD-10-CM | POA: Diagnosis not present

## 2016-10-21 DIAGNOSIS — H02132 Senile ectropion of right lower eyelid: Secondary | ICD-10-CM | POA: Diagnosis not present

## 2016-10-31 DIAGNOSIS — H6122 Impacted cerumen, left ear: Secondary | ICD-10-CM | POA: Diagnosis not present

## 2017-01-15 DIAGNOSIS — H6123 Impacted cerumen, bilateral: Secondary | ICD-10-CM | POA: Diagnosis not present

## 2017-06-12 DIAGNOSIS — H6123 Impacted cerumen, bilateral: Secondary | ICD-10-CM | POA: Diagnosis not present

## 2020-06-28 DEATH — deceased
